# Patient Record
Sex: Female | Born: 1960 | Race: White | Hispanic: No | Marital: Married | Smoking: Never smoker
Health system: Southern US, Community
[De-identification: ages and names within clinical notes are randomized; demographics above are authoritative.]

## PROBLEM LIST (undated history)

## (undated) DIAGNOSIS — E785 Hyperlipidemia, unspecified: Secondary | ICD-10-CM

## (undated) DIAGNOSIS — Z8619 Personal history of other infectious and parasitic diseases: Secondary | ICD-10-CM

## (undated) DIAGNOSIS — Z8669 Personal history of other diseases of the nervous system and sense organs: Secondary | ICD-10-CM

## (undated) HISTORY — DX: Hyperlipidemia, unspecified: E78.5

## (undated) HISTORY — DX: Personal history of other infectious and parasitic diseases: Z86.19

## (undated) HISTORY — DX: Personal history of other diseases of the nervous system and sense organs: Z86.69

---

## 2005-02-10 ENCOUNTER — Ambulatory Visit (HOSPITAL_COMMUNITY): Admission: RE | Admit: 2005-02-10 | Discharge: 2005-02-10 | Payer: Self-pay | Admitting: Family Medicine

## 2005-02-21 ENCOUNTER — Encounter: Admission: RE | Admit: 2005-02-21 | Discharge: 2005-02-21 | Payer: Self-pay | Admitting: Obstetrics and Gynecology

## 2006-02-06 ENCOUNTER — Encounter: Admission: RE | Admit: 2006-02-06 | Discharge: 2006-02-06 | Payer: Self-pay | Admitting: Obstetrics and Gynecology

## 2007-11-26 ENCOUNTER — Encounter: Admission: RE | Admit: 2007-11-26 | Discharge: 2007-11-26 | Payer: Self-pay | Admitting: Obstetrics and Gynecology

## 2007-12-10 ENCOUNTER — Encounter: Admission: RE | Admit: 2007-12-10 | Discharge: 2007-12-10 | Payer: Self-pay | Admitting: Obstetrics and Gynecology

## 2008-08-25 HISTORY — PX: NECK SURGERY: SHX720

## 2009-03-23 ENCOUNTER — Encounter: Admission: RE | Admit: 2009-03-23 | Discharge: 2009-03-23 | Payer: Self-pay | Admitting: Sports Medicine

## 2009-05-25 ENCOUNTER — Encounter: Admission: RE | Admit: 2009-05-25 | Discharge: 2009-05-25 | Payer: Self-pay | Admitting: Obstetrics and Gynecology

## 2009-08-22 ENCOUNTER — Inpatient Hospital Stay (HOSPITAL_COMMUNITY): Admission: RE | Admit: 2009-08-22 | Discharge: 2009-08-23 | Payer: Self-pay | Admitting: Orthopedic Surgery

## 2010-09-15 ENCOUNTER — Encounter: Payer: Self-pay | Admitting: Obstetrics and Gynecology

## 2010-11-25 LAB — CBC
HCT: 39.2 % (ref 36.0–46.0)
Hemoglobin: 13.7 g/dL (ref 12.0–15.0)
MCHC: 34.8 g/dL (ref 30.0–36.0)
MCV: 88.6 fL (ref 78.0–100.0)
Platelets: 212 10*3/uL (ref 150–400)
RDW: 13 % (ref 11.5–15.5)
WBC: 6.8 10*3/uL (ref 4.0–10.5)

## 2012-01-12 ENCOUNTER — Other Ambulatory Visit: Payer: Self-pay | Admitting: Obstetrics and Gynecology

## 2012-01-12 DIAGNOSIS — Z1231 Encounter for screening mammogram for malignant neoplasm of breast: Secondary | ICD-10-CM

## 2012-01-23 ENCOUNTER — Ambulatory Visit
Admission: RE | Admit: 2012-01-23 | Discharge: 2012-01-23 | Disposition: A | Discharge status: Home / Self Care | Payer: BC Managed Care – PPO | Source: Ambulatory Visit | Attending: Obstetrics and Gynecology | Admitting: Obstetrics and Gynecology

## 2012-01-23 DIAGNOSIS — Z1231 Encounter for screening mammogram for malignant neoplasm of breast: Secondary | ICD-10-CM

## 2013-11-11 ENCOUNTER — Encounter (INDEPENDENT_AMBULATORY_CARE_PROVIDER_SITE_OTHER): Payer: Self-pay

## 2013-11-11 ENCOUNTER — Encounter: Payer: Self-pay | Admitting: Adult Health

## 2013-11-11 ENCOUNTER — Ambulatory Visit (INDEPENDENT_AMBULATORY_CARE_PROVIDER_SITE_OTHER): Payer: BC Managed Care – PPO | Admitting: Adult Health

## 2013-11-11 VITALS — BP 110/74 | HR 73 | Temp 98.5°F | Resp 14 | Ht 65.5 in | Wt 232.0 lb

## 2013-11-11 DIAGNOSIS — R5383 Other fatigue: Secondary | ICD-10-CM | POA: Insufficient documentation

## 2013-11-11 DIAGNOSIS — Z0000 Encounter for general adult medical examination without abnormal findings: Secondary | ICD-10-CM

## 2013-11-11 DIAGNOSIS — R5381 Other malaise: Secondary | ICD-10-CM

## 2013-11-11 DIAGNOSIS — Z1211 Encounter for screening for malignant neoplasm of colon: Secondary | ICD-10-CM | POA: Insufficient documentation

## 2013-11-11 DIAGNOSIS — E785 Hyperlipidemia, unspecified: Secondary | ICD-10-CM | POA: Insufficient documentation

## 2013-11-11 DIAGNOSIS — R635 Abnormal weight gain: Secondary | ICD-10-CM

## 2013-11-11 LAB — BASIC METABOLIC PANEL
BUN: 16 mg/dL (ref 6–23)
CO2: 30 meq/L (ref 19–32)
Calcium: 9.7 mg/dL (ref 8.4–10.5)
Chloride: 102 mEq/L (ref 96–112)
Creatinine, Ser: 0.7 mg/dL (ref 0.4–1.2)
GFR: 98.6 mL/min (ref >60.00)
GLUCOSE: 93 mg/dL (ref 70–99)
Potassium: 4.3 mEq/L (ref 3.5–5.1)
SODIUM: 139 meq/L (ref 135–145)

## 2013-11-11 LAB — HEPATIC FUNCTION PANEL
ALBUMIN: 4.4 g/dL (ref 3.5–5.2)
ALT: 51 U/L — ABNORMAL HIGH (ref 0–35)
AST: 32 U/L (ref 0–37)
Alkaline Phosphatase: 117 U/L (ref 39–117)
BILIRUBIN DIRECT: 0 mg/dL (ref 0.0–0.3)
BILIRUBIN TOTAL: 0.5 mg/dL (ref 0.3–1.2)
Total Protein: 7.6 g/dL (ref 6.0–8.3)

## 2013-11-11 LAB — CBC WITH DIFFERENTIAL/PLATELET
Basophils Absolute: 0 10*3/uL (ref 0.0–0.1)
Basophils Relative: 0.6 % (ref 0.0–3.0)
EOS ABS: 0.4 10*3/uL (ref 0.0–0.7)
Eosinophils Relative: 6.1 % — ABNORMAL HIGH (ref 0.0–5.0)
HCT: 42.9 % (ref 36.0–46.0)
Hemoglobin: 14.3 g/dL (ref 12.0–15.0)
LYMPHS PCT: 31.1 % (ref 12.0–46.0)
Lymphs Abs: 2 10*3/uL (ref 0.7–4.0)
MCHC: 33.4 g/dL (ref 30.0–36.0)
MCV: 88.8 fl (ref 78.0–100.0)
MONO ABS: 0.4 10*3/uL (ref 0.1–1.0)
MONOS PCT: 5.6 % (ref 3.0–12.0)
NEUTROS ABS: 3.7 10*3/uL (ref 1.4–7.7)
Neutrophils Relative %: 56.6 % (ref 43.0–77.0)
PLATELETS: 201 10*3/uL (ref 150.0–400.0)
RBC: 4.83 Mil/uL (ref 3.87–5.11)
RDW: 13.1 % (ref 11.5–14.6)
WBC: 6.5 10*3/uL (ref 4.5–10.5)

## 2013-11-11 LAB — LIPID PANEL
CHOL/HDL RATIO: 7
Cholesterol: 313 mg/dL — ABNORMAL HIGH (ref 0–200)
HDL: 45.1 mg/dL (ref >39.00)
LDL CALC: 230 mg/dL — AB (ref 0–99)
TRIGLYCERIDES: 188 mg/dL — AB (ref 0.0–149.0)
VLDL: 37.6 mg/dL (ref 0.0–40.0)

## 2013-11-11 LAB — VITAMIN B12: VITAMIN B 12: 290 pg/mL (ref 211–911)

## 2013-11-11 LAB — TSH: TSH: 0.76 u[IU]/mL (ref 0.35–5.50)

## 2013-11-11 NOTE — Progress Notes (Signed)
Patient ID: Diana Finley, female   DOB: 06-Nov-1960, 53 y.o.   MRN: [id number]    Subjective:    Patient ID: Diana Finley, female    DOB: 06-Nov-1960, 53 y.o.   MRN: [id number]  HPI  Pt is a pleasant 53 y/o female who presents to clinic to establish care. She is followed by Dr. Senaida Oresichardson, Olegario MessierKathy. Last PAP was 2014. She is scheduled for her mammogram. She has not had a screening colonoscopy. Diana Finley would like help in losing weight. She is wondering if her thyroid is part of the problem. She reports being under stress and eats more when stressed.    Past Medical History  Diagnosis Date  . Hyperlipidemia   . History of chickenpox   . History of migraine     Dont have them currently      Past Surgical History  Procedure Laterality Date  . Neck surgery  2010    Cervical fusion     Family History  Problem Relation Age of Onset  . Hyperlipidemia Mother   . Hypertension Mother   . Cancer Mother     Skin Cancer   . Hyperlipidemia Father   . Hypertension Father   . Early death Sister     MVA     History   Social History  . Marital Status: Married    Spouse Name: N/A    Number of Children: 2  . Years of Education: 14   Occupational History  . Dental Assistant    Social History Main Topics  . Smoking status: Never Smoker   . Smokeless tobacco: Not on file  . Alcohol Use: No  . Drug Use: No  . Sexual Activity: Not on file   Other Topics Concern  . Not on file   Social History Narrative   Diana Finley [place] up in [place], [place]. She currently lives in [place], [place] with her husband. She works as a Sales executivedental assistant. She enjoys reading a working outside in the yard.    Review of Systems  Constitutional: Positive for fatigue.  HENT: Negative.   Eyes: Negative.   Respiratory: Negative.   Cardiovascular: Negative.   Gastrointestinal: Negative.   Endocrine: Negative.   Genitourinary: Negative.   Musculoskeletal: Negative.   Skin: Negative.     Allergic/Immunologic: Negative.   Neurological: Negative.   Hematological: Negative.   Psychiatric/Behavioral: Negative.  Nervous/anxious: increased stress - situational.        Objective:  There were no vitals taken for this visit.   Physical Exam  Constitutional: She is oriented to person, place, and time. She appears well-developed and well-nourished. No distress.  HENT:  Head: Normocephalic and atraumatic.  Right Ear: External ear normal.  Left Ear: External ear normal.  Nose: Nose normal.  Mouth/Throat: Oropharynx is clear and moist.  Eyes: Conjunctivae and EOM are normal. Pupils are equal, round, and reactive to light.  Neck: Normal range of motion. Neck supple. No tracheal deviation present. No thyromegaly present.  Cardiovascular: Normal rate, regular rhythm, normal heart sounds and intact distal pulses.  Exam reveals no gallop and no friction rub.   No murmur heard. Pulmonary/Chest: Effort normal and breath sounds normal. No respiratory distress. She has no wheezes. She has no rales.  Abdominal: Soft. Bowel sounds are normal. She exhibits no distension and no mass. There is no tenderness. There is no rebound and no guarding.  Musculoskeletal: Normal range of motion. She exhibits no edema and no tenderness.  Lymphadenopathy:    She  has no cervical adenopathy.  Neurological: She is alert and oriented to person, place, and time. She has normal reflexes. No cranial nerve deficit. Coordination normal.  Skin: Skin is warm and dry.  Psychiatric: She has a normal mood and affect. Her behavior is normal. Judgment and thought content normal.       Assessment & Plan:   1. Routine general medical examination at a health care facility Normal physical exam excluding breast, pelvic/pap. She is followed by GYN. Recently had exam. Screenings addressed. Will check routine labs  - CBC with Differential - Vit D  25 hydroxy (rtn osteoporosis monitoring) - Vitamin B12 - Basic metabolic  panel - Hepatic function panel  2. Screen for colon cancer Refer to Dr. Mechele Collin for her screening colonoscopy.  - Ambulatory referral to Gastroenterology  3. HLD (hyperlipidemia) Reports hx of HLD never on any medication. Family hx of HLD. Check labs. If elevated then start statin. - Lipid panel  4. Fatigue Check cbc and thyroid. Pt is deconditioned which can also be cause for her fatigue  - TSH - CBC  5. Weight Gain Pt was instructed to keep a food diary. Return in 1-2 weeks. We will discuss diet, exercise, labs and potential options for medication to help with weight control

## 2013-11-11 NOTE — Progress Notes (Signed)
Pre visit review using our clinic review tool, if applicable. No additional management support is needed unless otherwise documented below in the visit note. 

## 2013-11-11 NOTE — Patient Instructions (Signed)
   Thank you for choosing [facility] for your health care needs.  Please have your labs drawn prior to leaving the office.  The results will be available through MyChart for your convenience. Please remember to activate this. The activation code is located at the end of this form.  I am referring you for your screening colonoscopy with Dr. Mechele CollinElliott at [facility]. They will contact you with an appointment.  Please return in 1-2 weeks to discuss weight management. Please bring your food log with you that we discussed.

## 2013-11-12 LAB — VITAMIN D 25 HYDROXY (VIT D DEFICIENCY, FRACTURES): Vit D, 25-Hydroxy: 41 ng/mL (ref 30–89)

## 2013-11-18 ENCOUNTER — Ambulatory Visit (INDEPENDENT_AMBULATORY_CARE_PROVIDER_SITE_OTHER): Payer: BC Managed Care – PPO | Admitting: Adult Health

## 2013-11-18 ENCOUNTER — Encounter: Payer: Self-pay | Admitting: Adult Health

## 2013-11-18 VITALS — BP 106/70 | HR 76 | Temp 98.1°F | Resp 14 | Ht 64.5 in | Wt 230.0 lb

## 2013-11-18 DIAGNOSIS — R635 Abnormal weight gain: Secondary | ICD-10-CM

## 2013-11-18 MED ORDER — PHENTERMINE HCL 37.5 MG PO TABS
37.5000 mg | ORAL_TABLET | Freq: Every day | ORAL | Status: DC
Start: 2013-11-18 — End: 2013-12-23

## 2013-11-18 NOTE — Progress Notes (Signed)
Patient ID: Diana OuMichelle R Finley, female   DOB: 07/12/1961, 53 y.o.   MRN: [id number]    Subjective:    Patient ID: Diana OuMichelle R Finley, female    DOB: 07/12/1961, 53 y.o.   MRN: [id number]  HPI  Pt was instructed to keep a food diary and return in 1-2 weeks to discuss weight management. She is motivated to lose weight.    Past Medical History  Diagnosis Date  . Hyperlipidemia   . History of chickenpox   . History of migraine     Dont have them currently      Review of Systems  Constitutional: Negative.   HENT: Negative.   Eyes: Negative.   Respiratory: Negative.   Cardiovascular: Negative.   Gastrointestinal: Negative.   Endocrine: Negative.   Genitourinary: Negative.   Musculoskeletal: Negative.   Skin: Negative.   Allergic/Immunologic: Negative.   Neurological: Negative.   Hematological: Negative.   Psychiatric/Behavioral: Negative.        Objective:  BP 106/70  Pulse 76  Temp(Src) 98.1 F (36.7 C) (Oral)  Ht 5' 4.5" (1.638 m)  Wt 230 lb (104.327 kg)  BMI 38.88 kg/m2  SpO2 96%   Physical Exam  Constitutional: She is oriented to person, place, and time. No distress.  Cardiovascular: Normal rate and regular rhythm.   Blood pressure is very good  Pulmonary/Chest: Effort normal. No respiratory distress.  Musculoskeletal: Normal range of motion.  Neurological: She is oriented to person, place, and time.  Psychiatric: She has a normal mood and affect. Her behavior is normal. Judgment and thought content normal.       Assessment & Plan:   1. Weight gain Discussed diet in detail - low glycemic. Provided with Dr. Melina Schoolsullo's diet and review as well. Start Phentermine and discussed side effects. Start to increase activity. Start slow and progress to more activity. RTC in 1 month for follow up and recheck lipids and liver panel.

## 2013-11-18 NOTE — Progress Notes (Signed)
Pre visit review using our clinic review tool, if applicable. No additional management support is needed unless otherwise documented below in the visit note. 

## 2013-11-18 NOTE — Patient Instructions (Addendum)
  Start 1/2 tablet of phentermine. If you feel that you want to add the other half you can do so before lunch.  Eat slow this will allow your brain to send signals to your stomach that you are full.  Increase physical activity. Start slow and move forward with activity.  Follow the diet program that I gave you.  Return in one month for lab work and to follow up on weight loss.

## 2013-12-23 ENCOUNTER — Encounter: Payer: Self-pay | Admitting: Adult Health

## 2013-12-23 ENCOUNTER — Ambulatory Visit (INDEPENDENT_AMBULATORY_CARE_PROVIDER_SITE_OTHER): Payer: BC Managed Care – PPO | Admitting: Adult Health

## 2013-12-23 VITALS — BP 124/85 | HR 79 | Temp 98.1°F | Resp 14 | Wt 219.0 lb

## 2013-12-23 DIAGNOSIS — E663 Overweight: Secondary | ICD-10-CM

## 2013-12-23 DIAGNOSIS — R7402 Elevation of levels of lactic acid dehydrogenase (LDH): Secondary | ICD-10-CM

## 2013-12-23 DIAGNOSIS — R748 Abnormal levels of other serum enzymes: Secondary | ICD-10-CM

## 2013-12-23 DIAGNOSIS — R7401 Elevation of levels of liver transaminase levels: Secondary | ICD-10-CM

## 2013-12-23 DIAGNOSIS — R740 Nonspecific elevation of levels of transaminase and lactic acid dehydrogenase [LDH]: Principal | ICD-10-CM

## 2013-12-23 LAB — ALT: ALT: 39 U/L — AB (ref 0–35)

## 2013-12-23 MED ORDER — PHENTERMINE HCL 37.5 MG PO CAPS
37.5000 mg | ORAL_CAPSULE | ORAL | Status: DC
Start: 2013-12-23 — End: 2014-03-24

## 2013-12-23 NOTE — Progress Notes (Signed)
Pre visit review using our clinic review tool, if applicable. No additional management support is needed unless otherwise documented below in the visit note. 

## 2013-12-23 NOTE — Progress Notes (Signed)
Patient ID: Diana OuMichelle R Kloth, female   DOB: October 19, 1960, 53 y.o.   MRN: [id number]   Subjective:    Patient ID: Diana Finley, female    DOB: October 19, 1960, 53 y.o.   MRN: [id number]  HPI  Pleasant 53 year old female who presents to clinic for followup of elevated liver enzymes and for controlled weight loss. Patient was seen in clinic approximately one month ago. She was started on phentermine 37.5 mg daily. She was provided with Dr. Melina Schoolsullo's diet. She reports that medication is working well. No side effects reported. No elevation in her blood pressure. She does not feel jittery with this medication. She is taking medication exactly as prescribed. She is following diet and exercise. Patient has lost 13 pounds since her last visit. She was congratulated on this.  She has a history of hyperlipidemia and elevated liver enzymes. Patient first wanted to try diet and exercise prior to starting statin. No heart history reported or family history of cardiac disease. We did discuss allowing her time to continue with change in lifestyle to see if her cholesterol improves. She is very motivated. If her cholesterol is still elevated in June we will begin statin therapy.  Past Medical History  Diagnosis Date  . Hyperlipidemia   . History of chickenpox   . History of migraine     Dont have them currently     Review of Systems  Constitutional: Negative.   HENT: Negative.   Eyes: Negative.   Respiratory: Negative.   Cardiovascular: Negative.  Negative for chest pain and palpitations.  Gastrointestinal: Negative.   Endocrine: Negative.   Genitourinary: Negative.   Musculoskeletal: Negative.   Skin: Negative.   Allergic/Immunologic: Negative.   Neurological: Negative.   Hematological: Negative.   Psychiatric/Behavioral: Negative.  Negative for sleep disturbance. The patient is not nervous/anxious.        Objective:  BP 124/85  Pulse 79  Temp(Src) 98.1 F (36.7 C) (Oral)  Resp 14  Wt 219  lb (99.338 kg)  SpO2 100%   Physical Exam  Constitutional: She is oriented to person, place, and time. No distress.  13 lb weight loss in ~ 1 month  HENT:  Head: Normocephalic and atraumatic.  Eyes: Conjunctivae and EOM are normal.  Neck: Normal range of motion. Neck supple.  Cardiovascular: Normal rate, regular rhythm, normal heart sounds and intact distal pulses.  Exam reveals no gallop and no friction rub.   No murmur heard. Pulmonary/Chest: Effort normal and breath sounds normal. No respiratory distress. She has no wheezes. She has no rales.  Musculoskeletal: Normal range of motion.  Neurological: She is alert and oriented to person, place, and time. She has normal reflexes. Coordination normal.  Skin: Skin is warm and dry.  Psychiatric: She has a normal mood and affect. Her behavior is normal. Judgment and thought content normal.      Assessment & Plan:   1. Elevated ALT measurement Recheck levels today. Continue to follow. Follow up in 2 months and we will also check cholesterol at that time. - ALT  3. Overweight 13 lb weight loss in 1 month. Continue phentermine as prescribed. Prescription for capsules given today as her pharmacist advised that capsules will be less expensive. Congratulated patient on her accomplishment and advised to continue. I will see her again in 2 months.

## 2013-12-25 ENCOUNTER — Encounter: Payer: Self-pay | Admitting: Adult Health

## 2014-02-03 ENCOUNTER — Ambulatory Visit: Payer: BC Managed Care – PPO | Admitting: Adult Health

## 2014-03-14 ENCOUNTER — Other Ambulatory Visit: Payer: Self-pay

## 2014-03-14 DIAGNOSIS — Z1231 Encounter for screening mammogram for malignant neoplasm of breast: Secondary | ICD-10-CM

## 2014-03-24 ENCOUNTER — Encounter: Payer: Self-pay | Admitting: Adult Health

## 2014-03-24 ENCOUNTER — Ambulatory Visit (INDEPENDENT_AMBULATORY_CARE_PROVIDER_SITE_OTHER): Payer: BC Managed Care – PPO | Admitting: Adult Health

## 2014-03-24 ENCOUNTER — Encounter: Payer: Self-pay | Admitting: *Deleted

## 2014-03-24 VITALS — BP 118/74 | HR 77 | Temp 98.1°F | Resp 14 | Wt 211.0 lb

## 2014-03-24 DIAGNOSIS — R740 Nonspecific elevation of levels of transaminase and lactic acid dehydrogenase [LDH]: Principal | ICD-10-CM

## 2014-03-24 DIAGNOSIS — R7402 Elevation of levels of lactic acid dehydrogenase (LDH): Secondary | ICD-10-CM

## 2014-03-24 DIAGNOSIS — R7401 Elevation of levels of liver transaminase levels: Secondary | ICD-10-CM | POA: Insufficient documentation

## 2014-03-24 LAB — HEPATIC FUNCTION PANEL
ALT: 24 U/L (ref 0–35)
AST: 21 U/L (ref 0–37)
Albumin: 4 g/dL (ref 3.5–5.2)
Alkaline Phosphatase: 107 U/L (ref 39–117)
Bilirubin, Direct: 0.1 mg/dL (ref 0.0–0.3)
Total Bilirubin: 0.5 mg/dL (ref 0.2–1.2)
Total Protein: 6.9 g/dL (ref 6.0–8.3)

## 2014-03-24 LAB — IRON: Iron: 82 ug/dL (ref 42–145)

## 2014-03-24 LAB — FERRITIN: Ferritin: 88.7 ng/mL (ref 10.0–291.0)

## 2014-03-24 NOTE — Progress Notes (Signed)
Patient ID: Diana Finley, female   DOB: 26-May-1961, 53 y.o.   MRN: [id number]   Subjective:    Patient ID: Diana OuMichelle R Pickering, female    DOB: 26-May-1961, 53 y.o.   MRN: [id number]  HPI  Returns for f/u for elevated liver enzymes. We were not able to start her on statin for elevated cholesterol 2/2 to this. Her ALT has come down somewhat but not normal. She is feeling well overall. Still working on weight loss. She has lost 8 lbs since May.    Past Medical History  Diagnosis Date  . Hyperlipidemia   . History of chickenpox   . History of migraine     Dont have them currently     Current Outpatient Prescriptions on File Prior to Visit  Medication Sig Dispense Refill  . phentermine 37.5 MG capsule Take 1 capsule (37.5 mg total) by mouth every morning.  30 capsule  2   No current facility-administered medications on file prior to visit.     Review of Systems  Constitutional: Negative.   HENT: Negative.   Eyes: Negative.   Respiratory: Negative.   Cardiovascular: Negative.   Gastrointestinal: Negative.   Endocrine: Negative.   Genitourinary: Negative.   Musculoskeletal: Negative.   Skin: Negative.   Allergic/Immunologic: Negative.   Neurological: Negative.   Hematological: Negative.   Psychiatric/Behavioral: Negative.        Objective:  BP 118/74  Pulse 77  Temp(Src) 98.1 F (36.7 C) (Oral)  Resp 14  Wt 211 lb (95.709 kg)  SpO2 98%   Physical Exam  Constitutional: She is oriented to person, place, and time. No distress.  HENT:  Head: Normocephalic and atraumatic.  Eyes: Conjunctivae and EOM are normal.  Neck: Normal range of motion. Neck supple.  Cardiovascular: Normal rate, regular rhythm, normal heart sounds and intact distal pulses.  Exam reveals no gallop and no friction rub.   No murmur heard. Pulmonary/Chest: Effort normal and breath sounds normal. No respiratory distress. She has no wheezes. She has no rales.  Abdominal: Soft. Bowel sounds are  normal.  Musculoskeletal: Normal range of motion.  Neurological: She is alert and oriented to person, place, and time. She has normal reflexes. Coordination normal.  Skin: Skin is warm and dry.  Psychiatric: She has a normal mood and affect. Her behavior is normal. Judgment and thought content normal.      Assessment & Plan:   1. Transaminitis Check labs. Continue to encourage weight loss. Will send for ultrasound if abnormal labs. Follow - Ferritin - Iron - Iron and TIBC - Hepatitis B surface antigen - Hepatitis B surface antibody - Hepatitis B core antibody, total - Hepatitis C antibody - Hepatitis A antibody, IgM - ANA - Anti-Smith antibody - Hepatic function panel

## 2014-03-24 NOTE — Progress Notes (Signed)
Pre visit review using our clinic review tool, if applicable. No additional management support is needed unless otherwise documented below in the visit note. 

## 2014-03-24 NOTE — Addendum Note (Signed)
Addended by: Montine CircleMALDONADO, Gaylon Bentz D on: 03/24/2014 01:54 PM   Modules accepted: Orders

## 2014-03-25 LAB — HEPATITIS B CORE ANTIBODY, TOTAL: Hep B Core Total Ab: NONREACTIVE

## 2014-03-25 LAB — IRON AND TIBC
%SAT: 24 % (ref 20–55)
Iron: 81 ug/dL (ref 42–145)
TIBC: 339 ug/dL (ref 250–470)
UIBC: 258 ug/dL (ref 125–400)

## 2014-03-25 LAB — HEPATITIS C ANTIBODY: HCV AB: NEGATIVE

## 2014-03-25 LAB — HEPATITIS A ANTIBODY, TOTAL: HEP A TOTAL AB: NONREACTIVE

## 2014-03-25 LAB — HEPATITIS B SURFACE ANTIBODY,QUALITATIVE: HEP B S AB: NEGATIVE

## 2014-03-25 LAB — HEPATITIS A ANTIBODY, IGM: HEP A IGM: NONREACTIVE

## 2014-03-25 LAB — HEPATITIS B SURFACE ANTIGEN: HEP B S AG: NEGATIVE

## 2014-03-27 LAB — ANTI-SMITH ANTIBODY: ENA SM AB SER-ACNC: NEGATIVE

## 2014-03-31 ENCOUNTER — Ambulatory Visit: Payer: BC Managed Care – PPO

## 2014-04-07 ENCOUNTER — Encounter (INDEPENDENT_AMBULATORY_CARE_PROVIDER_SITE_OTHER): Payer: Self-pay

## 2014-04-07 ENCOUNTER — Ambulatory Visit
Admission: RE | Admit: 2014-04-07 | Discharge: 2014-04-07 | Disposition: A | Discharge status: Home / Self Care | Payer: BC Managed Care – PPO | Source: Ambulatory Visit

## 2014-04-07 DIAGNOSIS — Z1231 Encounter for screening mammogram for malignant neoplasm of breast: Secondary | ICD-10-CM

## 2014-11-01 ENCOUNTER — Telehealth: Payer: Self-pay | Admitting: Adult Health

## 2014-11-01 NOTE — Telephone Encounter (Signed)
Spoke to patient, states she will be seen in UC in [place] today.

## 2014-11-01 NOTE — Telephone Encounter (Signed)
Patient Name: Diana CapuchinMICHELLE Novakowski  DOB: February 04, 1961    Initial Comment Caller states she has a head cold that has gotten to her chest.    Nurse Assessment  Nurse: Renaldo FiddlerAdkins, RN, Raynelle FanningJulie Date/Time Lamount Cohen(Eastern Time): 11/01/2014 11:47:21 AM  Confirm and document reason for call. If symptomatic, describe symptoms. ---Caller states she had a "head cold" that has gotten in her chest. She is having coughing spasms that are getting worse, she is wheezing and her secretions are yellow at times. She has felt flushed, but no fever.  Has the patient traveled out of the country within the last 30 days? ---Not Applicable  Does the patient require triage? ---Yes  Related visit to physician within the last 2 weeks? ---No  Does the PT have any chronic conditions? (i.e. diabetes, asthma, etc.) ---No  Did the patient indicate they were pregnant? ---No     Guidelines    Guideline Title Affirmed Question Affirmed Notes  Cough - Acute Productive Wheezing is present    Final Disposition User   See Physician within 4 Hours (or PCP triage) Renaldo FiddlerAdkins, RN, Raynelle FanningJulie    Comments  I advised the caller that there were no afternoon appts available, states her wheezing has been intermittent and she would rather wait until tomorrow instead of going to ED or UC. Advised that I would include this in her note and she will be contacted by the office. Verbalized understanding, will cb as needed if her sx worsen.

## 2015-12-25 ENCOUNTER — Other Ambulatory Visit: Payer: Self-pay

## 2015-12-25 DIAGNOSIS — Z1231 Encounter for screening mammogram for malignant neoplasm of breast: Secondary | ICD-10-CM

## 2016-01-07 ENCOUNTER — Ambulatory Visit
Admission: RE | Admit: 2016-01-07 | Discharge: 2016-01-07 | Disposition: A | Discharge status: Home / Self Care | Payer: BLUE CROSS/BLUE SHIELD | Source: Ambulatory Visit

## 2016-01-07 DIAGNOSIS — Z1231 Encounter for screening mammogram for malignant neoplasm of breast: Secondary | ICD-10-CM

## 2017-03-31 ENCOUNTER — Other Ambulatory Visit: Payer: Self-pay | Admitting: Obstetrics and Gynecology

## 2017-03-31 DIAGNOSIS — Z1231 Encounter for screening mammogram for malignant neoplasm of breast: Secondary | ICD-10-CM

## 2017-04-09 ENCOUNTER — Ambulatory Visit
Admission: RE | Admit: 2017-04-09 | Discharge: 2017-04-09 | Disposition: A | Discharge status: Home / Self Care | Payer: BLUE CROSS/BLUE SHIELD | Source: Ambulatory Visit | Attending: Obstetrics and Gynecology | Admitting: Obstetrics and Gynecology

## 2017-04-09 DIAGNOSIS — Z1231 Encounter for screening mammogram for malignant neoplasm of breast: Secondary | ICD-10-CM

## 2017-08-24 DIAGNOSIS — R200 Anesthesia of skin: Secondary | ICD-10-CM | POA: Insufficient documentation

## 2017-09-02 DIAGNOSIS — G5601 Carpal tunnel syndrome, right upper limb: Secondary | ICD-10-CM | POA: Insufficient documentation

## 2019-01-06 ENCOUNTER — Emergency Department
Admission: EM | Admit: 2019-01-06 | Discharge: 2019-01-06 | Disposition: A | Discharge status: Home / Self Care | Payer: BLUE CROSS/BLUE SHIELD | Attending: Emergency Medicine | Admitting: Emergency Medicine

## 2019-01-06 ENCOUNTER — Other Ambulatory Visit: Payer: Self-pay

## 2019-01-06 ENCOUNTER — Encounter: Payer: Self-pay | Admitting: Emergency Medicine

## 2019-01-06 DIAGNOSIS — S61412A Laceration without foreign body of left hand, initial encounter: Secondary | ICD-10-CM | POA: Diagnosis present

## 2019-01-06 DIAGNOSIS — Y9389 Activity, other specified: Secondary | ICD-10-CM | POA: Insufficient documentation

## 2019-01-06 DIAGNOSIS — Y9289 Other specified places as the place of occurrence of the external cause: Secondary | ICD-10-CM | POA: Insufficient documentation

## 2019-01-06 DIAGNOSIS — Y998 Other external cause status: Secondary | ICD-10-CM | POA: Insufficient documentation

## 2019-01-06 DIAGNOSIS — W260XXA Contact with knife, initial encounter: Secondary | ICD-10-CM | POA: Diagnosis not present

## 2019-01-06 MED ORDER — CEPHALEXIN 500 MG PO CAPS
500.0000 mg | ORAL_CAPSULE | Freq: Once | ORAL | Status: AC
  Administered 2019-01-06: 21:00:00 500 mg via ORAL
  Filled 2019-01-06: qty 1

## 2019-01-06 MED ORDER — CEPHALEXIN 500 MG PO CAPS: 500.0000 mg | ORAL_CAPSULE | Freq: Four times a day (QID) | ORAL | 0 refills | Status: AC

## 2019-01-06 NOTE — ED Triage Notes (Signed)
Patient ambulatory to triage with steady gait, without difficulty or distress noted; pt reports cutting self with knife PTA; 2 small lac to top of left hand with no bleeding at present; guaze dressing applied

## 2019-01-06 NOTE — ED Provider Notes (Signed)
[facility] Emergency Department Provider Note  ____________________________________________  Time seen: Approximately 9:12 PM  I have reviewed the triage vital signs and the nursing notes.   HISTORY  Chief Complaint Laceration    HPI Diana Finley is a 58 y.o. female that presents to the emergency department for evaluation of laceration to left hand from a pair knife at work.  Patient states that she was cutting cheese when the knife somehow slipped and went through the top of her hand.  Knife went in one side right underneath the skin and out the middle.  Tetanus shot was in January.   Past Medical History:  Diagnosis Date  . History of chickenpox   . History of migraine    Dont have them currently   . Hyperlipidemia     Patient Active Problem List   Diagnosis Date Noted  . Transaminitis 03/24/2014  . Elevated liver enzymes 12/23/2013  . Overweight(278.02) 12/23/2013  . HLD (hyperlipidemia) 11/11/2013  . Routine general medical examination at a health care facility 11/11/2013  . Screen for colon cancer 11/11/2013  . Fatigue 11/11/2013  . Weight gain 11/11/2013    Past Surgical History:  Procedure Laterality Date  . NECK SURGERY  2010   Cervical fusion    Prior to Admission medications   Medication Sig Start Date End Date Taking? Authorizing Provider  cephALEXin (KEFLEX) 500 MG capsule Take 1 capsule (500 mg total) by mouth 4 (four) times daily for 10 days. 01/06/19 01/16/19  Enid DerryWagner, Yaquelin Langelier, PA-C    Allergies Patient has no known allergies.  Family History  Problem Relation Age of Onset  . Hyperlipidemia Mother   . Hypertension Mother   . Cancer Mother        Skin Cancer   . Hyperlipidemia Father   . Hypertension Father   . Early death Sister        MVA    Social History Social History   Tobacco Use  . Smoking status: Never Smoker  . Smokeless tobacco: Never Used  Substance Use Topics  . Alcohol use: No  . Drug use:  No     Review of Systems  Constitutional: No fever/chills Gastrointestinal: No nausea, no vomiting.  Musculoskeletal: Positive for hand pain. Skin: Negative for rash, ecchymosis.  Positive for laceration. Neurological: Negative for numbness or tingling   ____________________________________________   PHYSICAL EXAM:  VITAL SIGNS: ED Triage Vitals  Enc Vitals Group     BP 01/06/19 2006 (!) 143/75     Pulse Rate 01/06/19 2006 87     Resp 01/06/19 2006 16     Temp 01/06/19 2006 98 F (36.7 C)     Temp src --      SpO2 01/06/19 2006 100 %     Weight 01/06/19 2004 245 lb (111.1 kg)     Height 01/06/19 2004 5\' 5"  (1.651 m)     Head Circumference --      Peak Flow --      Pain Score 01/06/19 2004 3     Pain Loc --      Pain Edu? --      Excl. in GC? --      Constitutional: Alert and oriented. Well appearing and in no acute distress. Eyes: Conjunctivae are normal. PERRL. EOMI. Head: Atraumatic. ENT:      Ears:      Nose: No congestion/rhinnorhea.      Mouth/Throat: Mucous membranes are moist.  Neck: No stridor.  Cardiovascular: Normal  rate, regular rhythm.  Good peripheral circulation. Respiratory: Normal respiratory effort without tachypnea or retractions. Lungs CTAB. Good air entry to the bases with no decreased or absent breath sounds. Musculoskeletal: Full range of motion to all extremities. No gross deformities appreciated.  Full flexion and extension of all digits. Neurologic:  Normal speech and language. No gross focal neurologic deficits are appreciated.  Skin:  Skin is warm, dry.  Shallow through and through laceration to left dorsal hand. Psychiatric: Mood and affect are normal. Speech and behavior are normal. Patient exhibits appropriate insight and judgement.   ____________________________________________   LABS (all labs ordered are listed, but only abnormal results are displayed)  Labs Reviewed - No data to  display ____________________________________________  EKG   ____________________________________________  RADIOLOGY   No results found.  ____________________________________________    PROCEDURES  Procedure(s) performed:    Procedures  LACERATION REPAIR Performed by: Enid Derry  Consent: Verbal consent obtained.  Consent given by: patient  Prepped and Draped in normal sterile fashion  Wound explored: No foreign bodies   Laceration Location: hand  Laceration Length: 1 cm and 1/2 cm  Irrigation method: syringe  Amount of cleaning: normal saline  Skin closure: Steri-Strips  Patient tolerance: Patient tolerated the procedure well with no immediate complications.  Medications  cephALEXin (KEFLEX) capsule 500 mg (500 mg Oral Given 01/06/19 2052)     ____________________________________________   INITIAL IMPRESSION / ASSESSMENT AND PLAN / ED COURSE  Pertinent labs & imaging results that were available during my care of the patient were reviewed by me and considered in my medical decision making (see chart for details).  Review of the [place] CSRS was performed in accordance of the [facility] prior to dispensing any controlled drugs.   Patient's diagnosis is consistent with knife laceration.  Vital signs and exam are reassuring.  There is a shallow through and through laceration under the skin of her left hand.  Lacerations were repaired with Steri-Strips.  Patient will be discharged home with prescriptions for Keflex. Patient is to follow up with primary care as directed. Patient is given ED precautions to return to the ED for any worsening or new symptoms.     ____________________________________________  FINAL CLINICAL IMPRESSION(S) / ED DIAGNOSES  Final diagnoses:  Laceration of left hand, foreign body presence unspecified, initial encounter      NEW MEDICATIONS STARTED DURING THIS VISIT:  ED Discharge Orders         Ordered    cephALEXin  (KEFLEX) 500 MG capsule  4 times daily     01/06/19 2113              This chart was dictated using voice recognition software/Dragon. Despite best efforts to proofread, errors can occur which can change the meaning. Any change was purely unintentional.    Enid Derry, PA-C 01/06/19 2202    Emily Filbert, MD 01/06/19 [phone]

## 2019-01-06 NOTE — Discharge Instructions (Addendum)
Please keep hand covered and dry.  Strips and glue will fall off in about 5 days.  Wear wrist splint for 48 hours.  Take Keflex to prevent infection.  Please follow-up with primary care as needed.

## 2019-08-03 ENCOUNTER — Other Ambulatory Visit: Payer: Self-pay | Admitting: Obstetrics and Gynecology

## 2019-08-03 DIAGNOSIS — Z1231 Encounter for screening mammogram for malignant neoplasm of breast: Secondary | ICD-10-CM

## 2019-09-23 ENCOUNTER — Ambulatory Visit: Payer: BLUE CROSS/BLUE SHIELD

## 2019-10-14 ENCOUNTER — Ambulatory Visit: Payer: BC Managed Care – PPO | Attending: Internal Medicine

## 2019-10-14 DIAGNOSIS — Z23 Encounter for immunization: Secondary | ICD-10-CM | POA: Insufficient documentation

## 2019-10-14 NOTE — Progress Notes (Signed)
   Covid-19 Vaccination Clinic  Name:  KEILYNN MARANO    MRN: [id number] DOB: July 16, 1961  10/14/2019  Ms. Oberman was observed post Covid-19 immunization for 15 minutes without incidence. She was provided with Vaccine Information Sheet and instruction to access the V-Safe system.   Ms. Calico was instructed to call 911 with any severe reactions post vaccine: Marland Kitchen Difficulty breathing  . Swelling of your face and throat  . A fast heartbeat  . A bad rash all over your body  . Dizziness and weakness    Immunizations Administered    Name Date Dose VIS Date Route   Pfizer COVID-19 Vaccine 10/14/2019  1:16 PM 0.3 mL 08/05/2019 Intramuscular   Manufacturer: [employer], [employer]   Lot: [id number]   NDC: [address]-6

## 2019-10-28 ENCOUNTER — Ambulatory Visit
Admission: RE | Admit: 2019-10-28 | Discharge: 2019-10-28 | Disposition: A | Discharge status: Home / Self Care | Payer: BLUE CROSS/BLUE SHIELD | Source: Ambulatory Visit | Attending: Obstetrics and Gynecology | Admitting: Obstetrics and Gynecology

## 2019-10-28 ENCOUNTER — Other Ambulatory Visit: Payer: Self-pay

## 2019-10-28 DIAGNOSIS — Z1231 Encounter for screening mammogram for malignant neoplasm of breast: Secondary | ICD-10-CM

## 2019-11-08 ENCOUNTER — Ambulatory Visit: Payer: BC Managed Care – PPO | Attending: Internal Medicine

## 2019-11-08 DIAGNOSIS — Z23 Encounter for immunization: Secondary | ICD-10-CM

## 2019-11-08 NOTE — Progress Notes (Signed)
2  Covid-19 Vaccination Clinic  Name:  SALENE MOHAMUD    MRN: [id number] DOB: Jun 30, 1961  11/08/2019  Ms. Cadena was observed post Covid-19 immunization for 15 minutes without incident. She was provided with Vaccine Information Sheet and instruction to access the V-Safe system.   Ms. Horseman was instructed to call 911 with any severe reactions post vaccine: Marland Kitchen Difficulty breathing  . Swelling of face and throat  . A fast heartbeat  . A bad rash all over body  . Dizziness and weakness   Immunizations Administered    Name Date Dose VIS Date Route   Pfizer COVID-19 Vaccine 11/08/2019  8:27 AM 0.3 mL 08/05/2019 Intramuscular   Manufacturer: [employer], [employer]   Lot: [id number]   NDC: [address]-6

## 2022-01-27 ENCOUNTER — Other Ambulatory Visit: Payer: Self-pay | Admitting: Obstetrics and Gynecology

## 2022-01-27 DIAGNOSIS — Z1231 Encounter for screening mammogram for malignant neoplasm of breast: Secondary | ICD-10-CM

## 2022-02-14 ENCOUNTER — Ambulatory Visit
Admission: RE | Admit: 2022-02-14 | Discharge: 2022-02-14 | Disposition: A | Discharge status: Home / Self Care | Payer: BC Managed Care – PPO | Source: Ambulatory Visit | Attending: Obstetrics and Gynecology | Admitting: Obstetrics and Gynecology

## 2022-02-14 ENCOUNTER — Ambulatory Visit: Payer: BC Managed Care – PPO

## 2022-02-14 DIAGNOSIS — Z1231 Encounter for screening mammogram for malignant neoplasm of breast: Secondary | ICD-10-CM

## 2022-08-14 IMAGING — MG MM DIGITAL SCREENING BILAT W/ TOMO AND CAD
6 of 10 series · 6 of 30 positions shown · non-contrast
Comparison: Previous exam(s).

CLINICAL DATA: Screening.

EXAM:
DIGITAL SCREENING BILATERAL MAMMOGRAM WITH TOMOSYNTHESIS AND CAD
TECHNIQUE: Bilateral screening digital craniocaudal and mediolateral oblique
mammograms were obtained. Bilateral screening digital breast
tomosynthesis was performed. The images were evaluated with
computer-aided detection.

[L MLO synth-2D (1 of 2)]
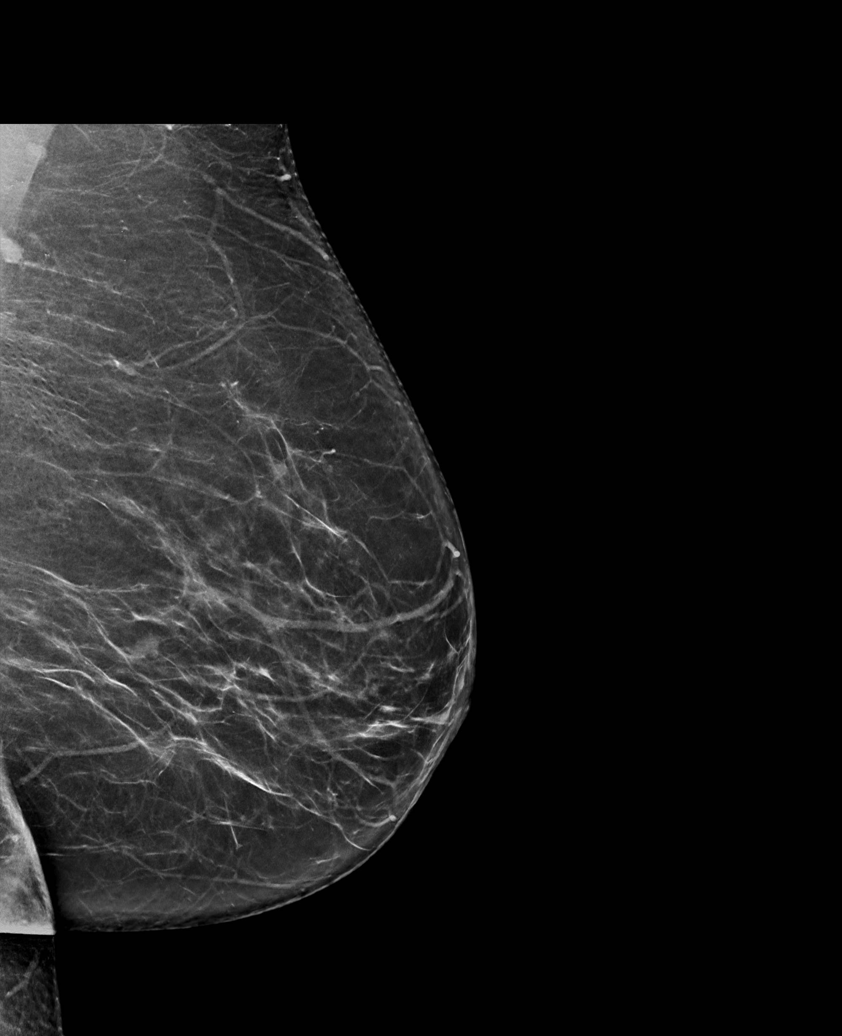

[R CC synth-2D]
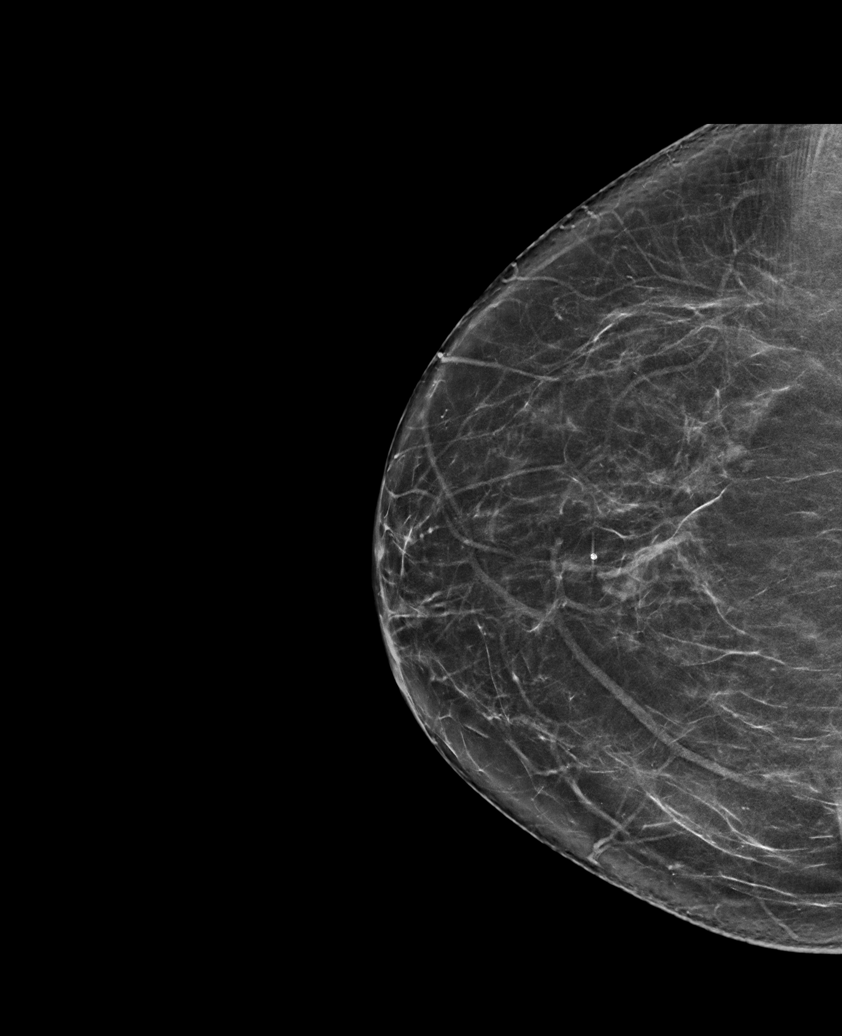

[L CC synth-2D]
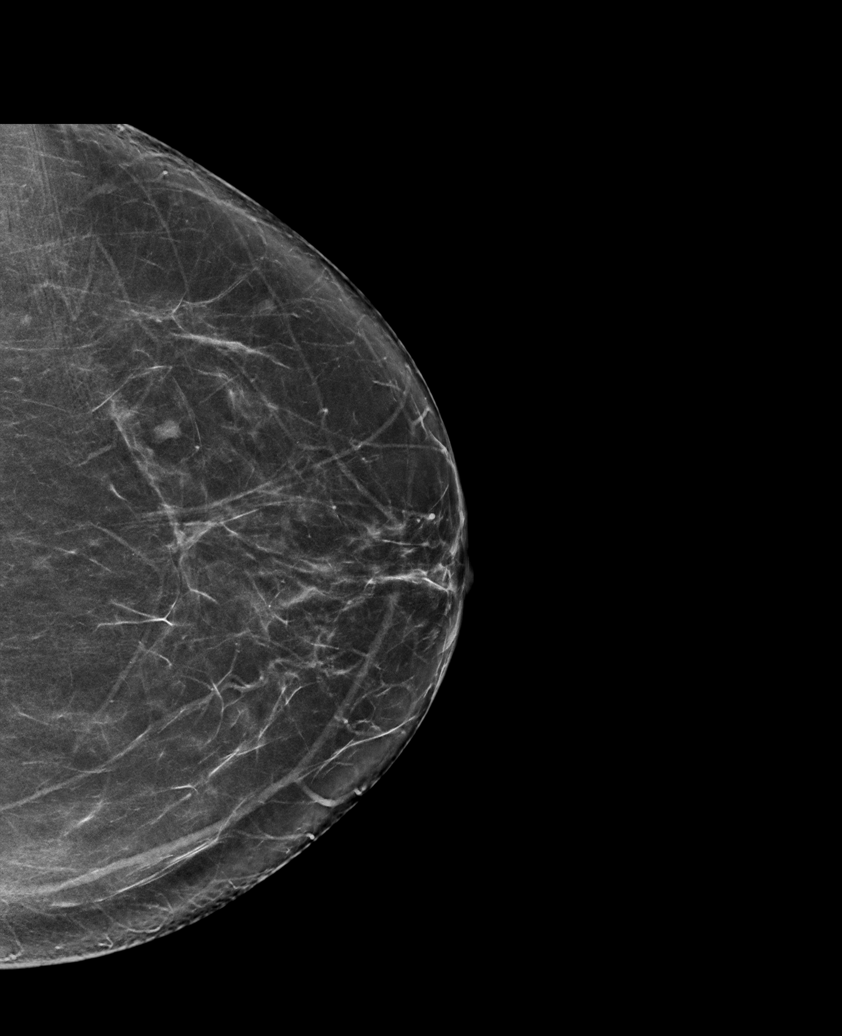

[L MLO synth-2D (2 of 2)]
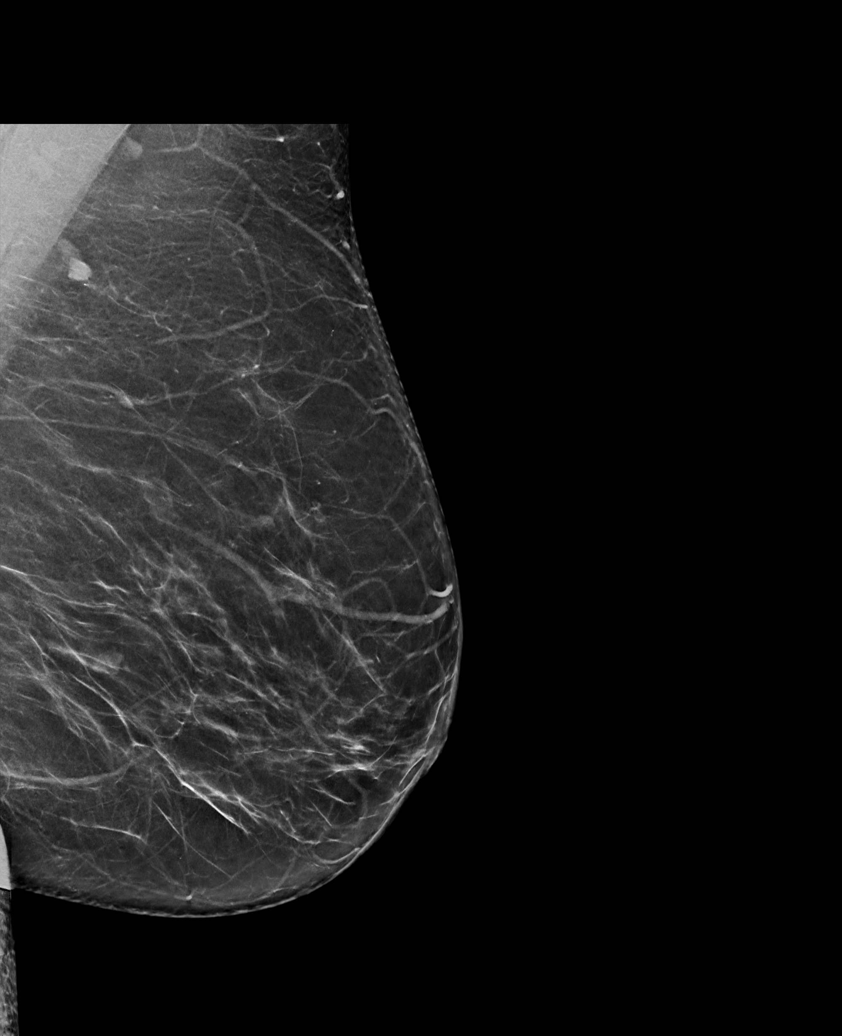

[R MLO synth-2D]
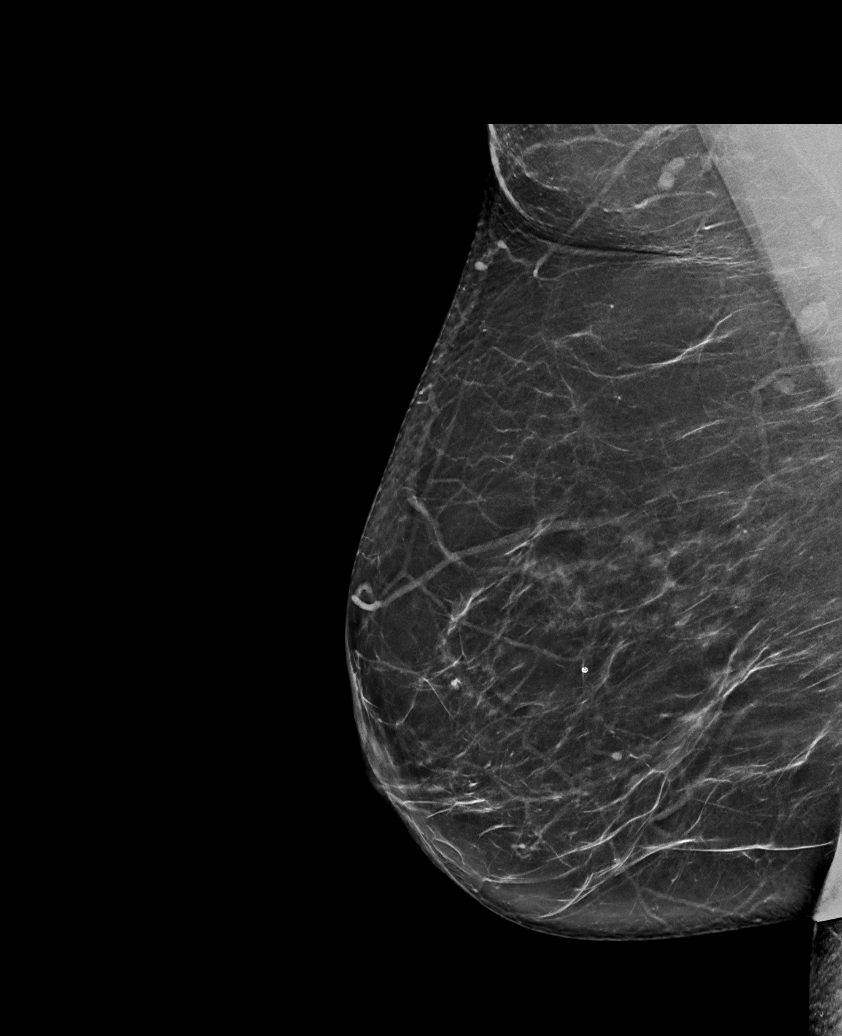

[L MLO tomo · tomo slice 41/80.0]
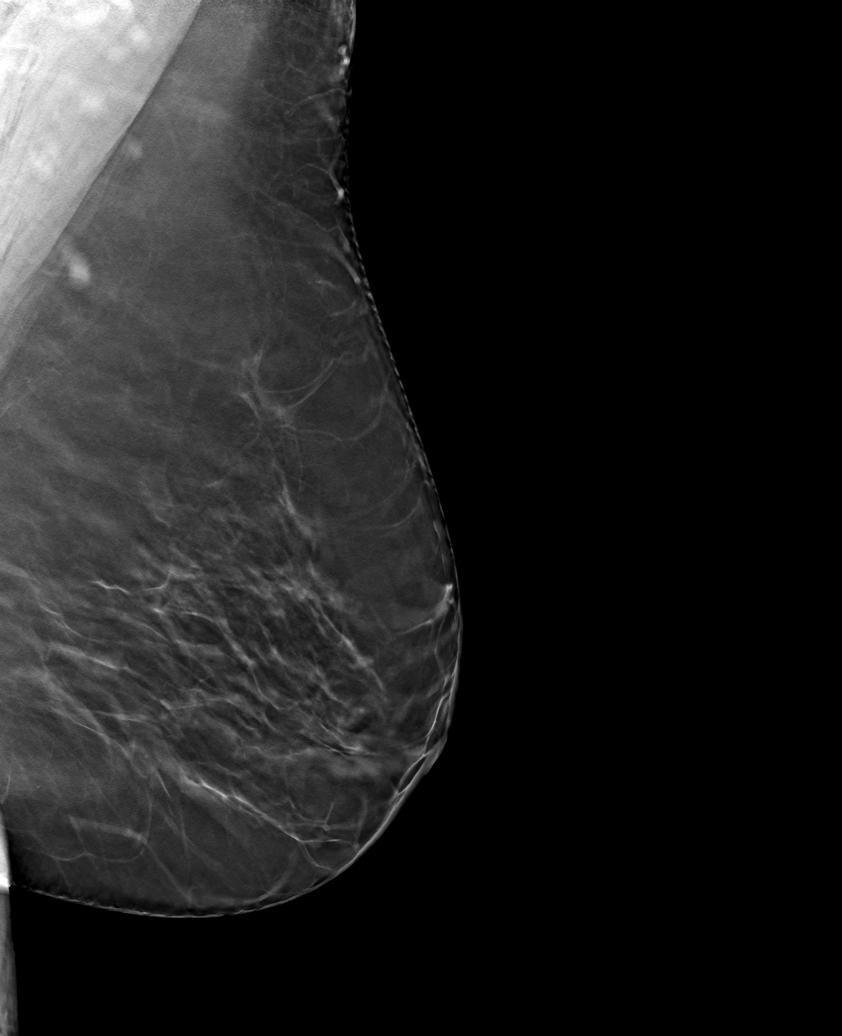

[6 of 30 positions shown; findings below may reference images not displayed]

ACR Breast Density Category b: There are scattered areas of
fibroglandular density.
FINDINGS: There are no findings suspicious for malignancy.
IMPRESSION: No mammographic evidence of malignancy. A result letter of this
screening mammogram will be mailed directly to the patient.

RECOMMENDATION:
Screening mammogram in one year. (Code:51-O-LD2)

BI-RADS CATEGORY  1: Negative.

## 2022-09-12 DIAGNOSIS — R7303 Prediabetes: Secondary | ICD-10-CM | POA: Diagnosis not present

## 2022-09-12 DIAGNOSIS — R7401 Elevation of levels of liver transaminase levels: Secondary | ICD-10-CM | POA: Diagnosis not present

## 2022-09-12 DIAGNOSIS — Z789 Other specified health status: Secondary | ICD-10-CM | POA: Diagnosis not present

## 2022-09-12 DIAGNOSIS — E785 Hyperlipidemia, unspecified: Secondary | ICD-10-CM | POA: Diagnosis not present

## 2022-10-31 DIAGNOSIS — E785 Hyperlipidemia, unspecified: Secondary | ICD-10-CM | POA: Diagnosis not present

## 2022-10-31 DIAGNOSIS — Z0130 Encounter for examination of blood pressure without abnormal findings: Secondary | ICD-10-CM | POA: Diagnosis not present

## 2022-10-31 DIAGNOSIS — Z6835 Body mass index (BMI) 35.0-35.9, adult: Secondary | ICD-10-CM | POA: Diagnosis not present

## 2022-10-31 DIAGNOSIS — R7401 Elevation of levels of liver transaminase levels: Secondary | ICD-10-CM | POA: Diagnosis not present

## 2022-10-31 DIAGNOSIS — R7303 Prediabetes: Secondary | ICD-10-CM | POA: Diagnosis not present

## 2023-01-09 DIAGNOSIS — R030 Elevated blood-pressure reading, without diagnosis of hypertension: Secondary | ICD-10-CM | POA: Diagnosis not present

## 2023-01-09 DIAGNOSIS — R7401 Elevation of levels of liver transaminase levels: Secondary | ICD-10-CM | POA: Diagnosis not present

## 2023-01-09 DIAGNOSIS — Z6834 Body mass index (BMI) 34.0-34.9, adult: Secondary | ICD-10-CM | POA: Diagnosis not present

## 2023-01-09 DIAGNOSIS — E785 Hyperlipidemia, unspecified: Secondary | ICD-10-CM | POA: Diagnosis not present

## 2023-01-09 DIAGNOSIS — R7303 Prediabetes: Secondary | ICD-10-CM | POA: Diagnosis not present

## 2023-04-23 DIAGNOSIS — E785 Hyperlipidemia, unspecified: Secondary | ICD-10-CM | POA: Diagnosis not present

## 2023-04-23 DIAGNOSIS — R030 Elevated blood-pressure reading, without diagnosis of hypertension: Secondary | ICD-10-CM | POA: Diagnosis not present

## 2023-04-23 DIAGNOSIS — E6609 Other obesity due to excess calories: Secondary | ICD-10-CM | POA: Diagnosis not present

## 2023-04-23 DIAGNOSIS — R7303 Prediabetes: Secondary | ICD-10-CM | POA: Diagnosis not present

## 2023-04-23 DIAGNOSIS — Z6835 Body mass index (BMI) 35.0-35.9, adult: Secondary | ICD-10-CM | POA: Diagnosis not present

## 2023-04-25 ENCOUNTER — Other Ambulatory Visit: Payer: Self-pay | Admitting: Adult Medicine

## 2023-04-25 DIAGNOSIS — Z1231 Encounter for screening mammogram for malignant neoplasm of breast: Secondary | ICD-10-CM

## 2023-05-05 DIAGNOSIS — Z1211 Encounter for screening for malignant neoplasm of colon: Secondary | ICD-10-CM | POA: Diagnosis not present

## 2023-05-08 DIAGNOSIS — Z6835 Body mass index (BMI) 35.0-35.9, adult: Secondary | ICD-10-CM | POA: Diagnosis not present

## 2023-05-08 DIAGNOSIS — R7303 Prediabetes: Secondary | ICD-10-CM | POA: Diagnosis not present

## 2023-05-08 DIAGNOSIS — R030 Elevated blood-pressure reading, without diagnosis of hypertension: Secondary | ICD-10-CM | POA: Diagnosis not present

## 2023-05-08 DIAGNOSIS — E6609 Other obesity due to excess calories: Secondary | ICD-10-CM | POA: Diagnosis not present

## 2023-05-08 DIAGNOSIS — E785 Hyperlipidemia, unspecified: Secondary | ICD-10-CM | POA: Diagnosis not present

## 2023-05-11 ENCOUNTER — Ambulatory Visit
Admission: RE | Admit: 2023-05-11 | Discharge: 2023-05-11 | Disposition: A | Discharge status: Home / Self Care | Payer: BC Managed Care – PPO | Source: Ambulatory Visit | Attending: Adult Medicine | Admitting: Adult Medicine

## 2023-05-11 DIAGNOSIS — R0609 Other forms of dyspnea: Secondary | ICD-10-CM | POA: Diagnosis not present

## 2023-05-11 DIAGNOSIS — Z6835 Body mass index (BMI) 35.0-35.9, adult: Secondary | ICD-10-CM | POA: Diagnosis not present

## 2023-05-11 DIAGNOSIS — R0602 Shortness of breath: Secondary | ICD-10-CM | POA: Diagnosis not present

## 2023-05-11 DIAGNOSIS — E785 Hyperlipidemia, unspecified: Secondary | ICD-10-CM | POA: Diagnosis not present

## 2023-05-11 DIAGNOSIS — Z1231 Encounter for screening mammogram for malignant neoplasm of breast: Secondary | ICD-10-CM

## 2023-05-13 ENCOUNTER — Other Ambulatory Visit: Payer: Self-pay | Admitting: Adult Medicine

## 2023-05-13 DIAGNOSIS — R928 Other abnormal and inconclusive findings on diagnostic imaging of breast: Secondary | ICD-10-CM

## 2023-05-14 ENCOUNTER — Ambulatory Visit: Payer: BC Managed Care – PPO

## 2023-05-14 ENCOUNTER — Ambulatory Visit
Admission: RE | Admit: 2023-05-14 | Discharge: 2023-05-14 | Disposition: A | Discharge status: Home / Self Care | Payer: BC Managed Care – PPO | Source: Ambulatory Visit | Attending: Adult Medicine | Admitting: Adult Medicine

## 2023-05-14 DIAGNOSIS — R928 Other abnormal and inconclusive findings on diagnostic imaging of breast: Secondary | ICD-10-CM | POA: Diagnosis not present

## 2023-05-29 DIAGNOSIS — K635 Polyp of colon: Secondary | ICD-10-CM | POA: Diagnosis not present

## 2023-05-29 DIAGNOSIS — Z1211 Encounter for screening for malignant neoplasm of colon: Secondary | ICD-10-CM | POA: Diagnosis not present

## 2023-06-05 DIAGNOSIS — R7303 Prediabetes: Secondary | ICD-10-CM | POA: Diagnosis not present

## 2023-06-05 DIAGNOSIS — R030 Elevated blood-pressure reading, without diagnosis of hypertension: Secondary | ICD-10-CM | POA: Diagnosis not present

## 2023-06-05 DIAGNOSIS — Z6835 Body mass index (BMI) 35.0-35.9, adult: Secondary | ICD-10-CM | POA: Diagnosis not present

## 2023-06-05 DIAGNOSIS — E785 Hyperlipidemia, unspecified: Secondary | ICD-10-CM | POA: Diagnosis not present

## 2023-06-05 DIAGNOSIS — E6609 Other obesity due to excess calories: Secondary | ICD-10-CM | POA: Diagnosis not present

## 2023-06-10 DIAGNOSIS — K635 Polyp of colon: Secondary | ICD-10-CM | POA: Diagnosis not present

## 2023-06-30 DIAGNOSIS — L821 Other seborrheic keratosis: Secondary | ICD-10-CM | POA: Diagnosis not present

## 2023-06-30 DIAGNOSIS — L570 Actinic keratosis: Secondary | ICD-10-CM | POA: Diagnosis not present

## 2023-06-30 DIAGNOSIS — D1801 Hemangioma of skin and subcutaneous tissue: Secondary | ICD-10-CM | POA: Diagnosis not present

## 2023-07-10 DIAGNOSIS — Z0130 Encounter for examination of blood pressure without abnormal findings: Secondary | ICD-10-CM | POA: Diagnosis not present

## 2023-07-10 DIAGNOSIS — E559 Vitamin D deficiency, unspecified: Secondary | ICD-10-CM | POA: Diagnosis not present

## 2023-07-10 DIAGNOSIS — E6609 Other obesity due to excess calories: Secondary | ICD-10-CM | POA: Diagnosis not present

## 2023-07-10 DIAGNOSIS — Z6837 Body mass index (BMI) 37.0-37.9, adult: Secondary | ICD-10-CM | POA: Diagnosis not present

## 2023-07-10 DIAGNOSIS — R7303 Prediabetes: Secondary | ICD-10-CM | POA: Diagnosis not present

## 2023-07-10 DIAGNOSIS — E785 Hyperlipidemia, unspecified: Secondary | ICD-10-CM | POA: Diagnosis not present

## 2023-07-10 DIAGNOSIS — R030 Elevated blood-pressure reading, without diagnosis of hypertension: Secondary | ICD-10-CM | POA: Diagnosis not present

## 2023-08-07 DIAGNOSIS — Z6836 Body mass index (BMI) 36.0-36.9, adult: Secondary | ICD-10-CM | POA: Diagnosis not present

## 2023-08-07 DIAGNOSIS — R030 Elevated blood-pressure reading, without diagnosis of hypertension: Secondary | ICD-10-CM | POA: Diagnosis not present

## 2023-08-07 DIAGNOSIS — E785 Hyperlipidemia, unspecified: Secondary | ICD-10-CM | POA: Diagnosis not present

## 2023-08-07 DIAGNOSIS — E6609 Other obesity due to excess calories: Secondary | ICD-10-CM | POA: Diagnosis not present

## 2023-08-07 DIAGNOSIS — Z0130 Encounter for examination of blood pressure without abnormal findings: Secondary | ICD-10-CM | POA: Diagnosis not present

## 2023-08-07 DIAGNOSIS — R7303 Prediabetes: Secondary | ICD-10-CM | POA: Diagnosis not present

## 2023-08-07 DIAGNOSIS — Z79899 Other long term (current) drug therapy: Secondary | ICD-10-CM | POA: Diagnosis not present

## 2023-09-25 ENCOUNTER — Ambulatory Visit: Payer: BC Managed Care – PPO | Attending: Cardiovascular Disease | Admitting: Cardiovascular Disease

## 2023-09-25 ENCOUNTER — Encounter: Payer: Self-pay | Admitting: Cardiovascular Disease

## 2023-09-25 VITALS — BP 118/76 | HR 73 | Ht 64.0 in | Wt 221.0 lb

## 2023-09-25 DIAGNOSIS — E785 Hyperlipidemia, unspecified: Secondary | ICD-10-CM

## 2023-09-25 DIAGNOSIS — E663 Overweight: Secondary | ICD-10-CM | POA: Diagnosis not present

## 2023-09-25 DIAGNOSIS — R635 Abnormal weight gain: Secondary | ICD-10-CM

## 2023-09-25 NOTE — Assessment & Plan Note (Signed)
History of hyperlipidemia with lipid profile performed by her PCP 07/10/2023 revealing total cholesterol 299, LDL 218 and HDL of 56.  She has been intolerant to statins in the past.  She was given a prescription by her PCP for pravastatin which I have encouraged her to start.  We will get a coronary calcium score to her stratify her.  If she is statin intolerant she would be a good candidate to start a PCSK9.

## 2023-09-25 NOTE — Patient Instructions (Signed)
Medication Instructions:   START PRAVASTATIN  *If you need a refill on your cardiac medications before your next appointment, please call your pharmacy*   Testing/Procedures:  CORONARY CALCIUM SCORING CT SCAN AT THE [facility]   Follow-Up: At [facility], you and your health needs are our priority.  As part of our continuing mission to provide you with exceptional heart care, we have created designated Provider Care Teams.  These Care Teams include your primary Cardiologist (physician) and Advanced Practice Providers (APPs -  Physician Assistants and Nurse Practitioners) who all work together to provide you with the care you need, when you need it.   Your next appointment:   6 month(s)  Provider:   Nanetta Batty MD

## 2023-09-25 NOTE — Assessment & Plan Note (Signed)
History of morbid obesity with BMI of 38.  I am referring her to [facility] diet wellness for physician assisted weight loss.

## 2023-09-25 NOTE — Progress Notes (Signed)
09/25/2023 [place]   [birth year]  [id number]  Primary Physician Patient, No Pcp Per Primary Cardiologist: Runell Gess MD Roseanne Reno  HPI:  [place] is a 63 y.o. severely overweight married Caucasian female mother of 2 children, grandmother of 3 grandchildren who is an Print production planner at a [employer].  She was referred by her PCP for cardiovascular valuation because of hyperlipidemia.  She has no other cardiac risk factors.  She is never had a heart attack or stroke.  She really does not exercise but denies chest pain or shortness of breath.  She has lost 60 pounds in the past.  Her most recent lipid profile performed 07/10/2023 revealed a total cholesterol 299, LDL 218 and HDL 56.   No outpatient medications have been marked as taking for the 09/25/23 encounter (Office Visit) with Runell Gess, MD.     No Known Allergies  Social History   Socioeconomic History   Marital status: Married    Spouse name: Not on file   Number of children: 2   Years of education: 14   Highest education level: Not on file  Occupational History   Occupation: Sales executive    Employer: [employer]  Tobacco Use   Smoking status: Never   Smokeless tobacco: Never  Substance and Sexual Activity   Alcohol use: No   Drug use: No   Sexual activity: Not on file  Other Topics Concern   Not on file  Social History Narrative   [place] husband. She works as a Sales executive. She enjoys reading a working outside in the yard.   Social Drivers of Corporate investment banker Strain: Not on file  Food Insecurity: Not on file  Transportation Needs: Not on file  Physical Activity: Not on file  Stress: Not on file  Social Connections: Not on file  Intimate Partner Violence: Not on file     Review of Systems: General: negative for chills, fever, night sweats or weight changes.   Cardiovascular: negative for chest pain, dyspnea on exertion, edema, orthopnea, palpitations, paroxysmal nocturnal dyspnea or shortness of breath Dermatological: negative for rash Respiratory: negative for cough or wheezing Urologic: negative for hematuria Abdominal: negative for nausea, vomiting, diarrhea, bright red blood per rectum, melena, or hematemesis Neurologic: negative for visual changes, syncope, or dizziness All other systems reviewed and are otherwise negative except as noted above.    Blood pressure 118/76, pulse 73, height 5\' 4"  (1.626 m), weight 221 lb (100.2 kg), SpO2 97%.  General appearance: alert and no distress Neck: no adenopathy, no carotid bruit, no JVD, supple, symmetrical, trachea midline, and thyroid not enlarged, symmetric, no tenderness/mass/nodules Lungs: clear to auscultation bilaterally Heart: regular rate and rhythm, S1, S2 normal, no murmur, click, rub or gallop Extremities: extremities normal, atraumatic, no cyanosis or edema Pulses: 2+ and symmetric Skin: Skin color, texture, turgor normal. No rashes or lesions Neurologic: Grossly normal  EKG EKG Interpretation Date/Time:  Friday September 25 2023 09:48:07 EST Ventricular Rate:  71 PR Interval:  162 QRS Duration:  78 QT Interval:  402 QTC Calculation: 436 R Axis:   80  Text Interpretation: Normal sinus rhythm Possible Left atrial enlargement No previous ECGs available Confirmed by Nanetta Batty [phone]) on 09/25/2023 9:56:58 AM    ASSESSMENT AND PLAN:   HLD (hyperlipidemia) History of hyperlipidemia with lipid profile performed by her PCP  07/10/2023 revealing total cholesterol 299, LDL 218 and HDL of 56.  She has been intolerant to statins in the past.  She was given a prescription by her PCP for pravastatin which I have encouraged her to start.  We will get a coronary calcium score to her stratify her.  If she is statin intolerant she would be a good candidate to start a  PCSK9.  Overweight History of morbid obesity with BMI of 38.  I am referring her to [facility] diet wellness for physician assisted weight loss.     Runell Gess MD FACP,FACC,FAHA, [facility] 09/25/2023 10:14 AM

## 2023-09-30 ENCOUNTER — Encounter: Payer: Self-pay | Admitting: Cardiovascular Disease

## 2023-09-30 ENCOUNTER — Other Ambulatory Visit (HOSPITAL_COMMUNITY): Payer: Self-pay

## 2023-09-30 MED ORDER — PRAVASTATIN SODIUM 20 MG PO TABS: 20.0000 mg | ORAL_TABLET | Freq: Every day | ORAL | 3 refills | Status: AC

## 2023-09-30 MED ORDER — PRAVASTATIN SODIUM 20 MG PO TABS: 20.0000 mg | ORAL_TABLET | Freq: Every day | ORAL | 3 refills | Status: DC

## 2023-10-16 ENCOUNTER — Ambulatory Visit (HOSPITAL_BASED_OUTPATIENT_CLINIC_OR_DEPARTMENT_OTHER)
Admission: RE | Admit: 2023-10-16 | Discharge: 2023-10-16 | Disposition: A | Discharge status: Home / Self Care | Payer: Self-pay | Source: Ambulatory Visit | Attending: Cardiovascular Disease | Admitting: Cardiovascular Disease

## 2023-10-16 DIAGNOSIS — E785 Hyperlipidemia, unspecified: Secondary | ICD-10-CM | POA: Insufficient documentation

## 2023-10-19 DIAGNOSIS — E785 Hyperlipidemia, unspecified: Secondary | ICD-10-CM

## 2023-10-28 ENCOUNTER — Encounter (INDEPENDENT_AMBULATORY_CARE_PROVIDER_SITE_OTHER): Payer: Self-pay

## 2023-12-18 DIAGNOSIS — E785 Hyperlipidemia, unspecified: Secondary | ICD-10-CM | POA: Diagnosis not present

## 2023-12-19 LAB — LIPID PANEL
Chol/HDL Ratio: 4.3 ratio (ref 0.0–4.4)
Cholesterol, Total: 251 mg/dL — ABNORMAL HIGH (ref 100–199)
HDL: 59 mg/dL (ref >39)
LDL Chol Calc (NIH): 170 mg/dL — ABNORMAL HIGH (ref 0–99)
Triglycerides: 122 mg/dL (ref 0–149)
VLDL Cholesterol Cal: 22 mg/dL (ref 5–40)

## 2023-12-19 LAB — HEPATIC FUNCTION PANEL
ALT: 22 IU/L (ref 0–32)
AST: 22 IU/L (ref 0–40)
Albumin: 4.2 g/dL (ref 3.9–4.9)
Alkaline Phosphatase: 116 IU/L (ref 44–121)
Bilirubin Total: 0.6 mg/dL (ref 0.0–1.2)
Bilirubin, Direct: 0.18 mg/dL (ref 0.00–0.40)
Total Protein: 6.8 g/dL (ref 6.0–8.5)

## 2023-12-25 ENCOUNTER — Telehealth: Payer: Self-pay

## 2023-12-25 ENCOUNTER — Telehealth: Payer: Self-pay | Admitting: Pharmacist

## 2023-12-25 ENCOUNTER — Encounter: Payer: Self-pay | Admitting: Pharmacist

## 2023-12-25 ENCOUNTER — Other Ambulatory Visit (HOSPITAL_COMMUNITY): Payer: Self-pay

## 2023-12-25 ENCOUNTER — Ambulatory Visit: Payer: BC Managed Care – PPO | Attending: Cardiology | Admitting: Pharmacist

## 2023-12-25 DIAGNOSIS — R931 Abnormal findings on diagnostic imaging of heart and coronary circulation: Secondary | ICD-10-CM

## 2023-12-25 DIAGNOSIS — E785 Hyperlipidemia, unspecified: Secondary | ICD-10-CM | POA: Diagnosis not present

## 2023-12-25 DIAGNOSIS — I700 Atherosclerosis of aorta: Secondary | ICD-10-CM | POA: Insufficient documentation

## 2023-12-25 DIAGNOSIS — R635 Abnormal weight gain: Secondary | ICD-10-CM

## 2023-12-25 NOTE — Telephone Encounter (Signed)
 Pharmacy Patient Advocate Encounter  Received notification from [facility] that Prior Authorization for REPATHA has been APPROVED from 12/25/23 to 12/24/24. Ran test claim, Copay is $35. This test claim was processed through [facility]- copay amounts may vary at other pharmacies due to pharmacy/plan contracts, or as the patient moves through the different stages of their insurance plan.

## 2023-12-25 NOTE — Progress Notes (Signed)
 Patient ID: [place]                 DOB: 20-Jan-1961                    MRN: [id number]     HPI: [place] is a 63 y.o. female patient referred to lipid clinic by Dr Katheryne Pane to discuss lipid monitoring and weight loss. PMH is significant for HLD (possibly familial), elevated coronary calcium score and obesity. Patient is intolerant to high intensity statins.  Patient presents today to discuss lipid management. LDL has been chronically high. TC 299, LDL 218 on 07/10/23. Reports strong family history of hyperlipidemia.  Reports she has tried atorvastatin and rosuvastatin in the past but both caused muscle and joint pain. Is able to tolerate pravastatin  20mg  once daily but still experiences occasional myalgias.  PCP started her on Rybelsus about a year ago which helped her lower her A1c and led to weight loss. However she no longer had insurance approval and has since gained weight when it was discontinued. Reports her biggest concern is snacking between meals.  Current Medications:  Pravastatin  20mg  daily  Intolerances:  Lovastatin Atorvastatin Rosuvastatin  Risk Factors:  HLD Elevated coronary calcium score Family history  LDL goal: <70  Labs: TC 251, Trigs 122, HDL 59, LDL 170 (12/18/23 on pravastatin  20mg )  Imaging: IMPRESSION:  1. Coronary calcium score of 255. This was 96th percentile for age, gender, and race matched controls.   2. Aortic atherosclerosis.  Past Medical History:  Diagnosis Date   History of chickenpox    History of migraine    Dont have them currently    Hyperlipidemia     Current Outpatient Medications on File Prior to Visit  Medication Sig Dispense Refill   phentermine  15 MG capsule Take 15 mg by mouth daily. (Patient not taking: Reported on 09/25/2023)     pravastatin  (PRAVACHOL ) 20 MG tablet Take 1 tablet (20 mg total) by mouth daily. 90 tablet 3   No current facility-administered medications on file prior to visit.    No  Known Allergies  Assessment/Plan:  1. Hyperlipidemia - Patient last LDL 170 which is improved from 218 but remains well above goal of <70. Unlikely pravastatin  increase will reduce LDL to goal and may exacerbate adverse effects. Recommend starting PCSK9i due to CAD.  Using demo pen, educated patient on mechanism of action, storage, site selection, administration, and possible adverse effects. Will complete PA and contact patient with results. Recheck lipid panel in 2-3 months.  2. Weight Loss - Patient demonstrated positive results with oral semaglutide. Would likely benefit from injectable. Using demo pen, educated patient on mechanism of action, storage, site selection, administration, and possible adverse effects. Explained dose titration. Discussed healthy eating and recommended increasing vegetables, lean proteins, healthy fats, and whole grains. Will complete PA and contact patient with result.  [place], PharmD, BCACP, CDCES, CPP [address], [address] [place], [place], [address] Phone: [phone], Fax: [phone]

## 2023-12-25 NOTE — Telephone Encounter (Signed)
 Pharmacy Patient Advocate Encounter   Received notification from Physician's Office that prior authorization for REPATHA is required/requested.   Insurance verification completed.   The patient is insured through [facility] .   Per test claim: PA required; PA submitted to above mentioned insurance via CoverMyMeds Key/confirmation #/EOC [id number] Status is pending

## 2023-12-25 NOTE — Telephone Encounter (Signed)
 PA request for Repatha has been Submitted. Team messaged you on [facility]. Plan/benefit exclusion. New Encounter has been or will be created for follow up. For additional info see Pharmacy Prior Auth telephone encounter from 12/25/23.

## 2023-12-25 NOTE — Patient Instructions (Addendum)
 It was nice meeting you today  We would like your LDL (bad cholesterol) to be less than 70  Please continue your pravastatin  20mg  daily  The medication we spoke about today is called Repatha which is an injection you would take once every 2 weeks  I will complete the prior authorization for you and contact you when it is approved. We would like to recheck your fasting lipid panel in about 3 months  I will also complete the prior authorization for [facility] and let you know the result  Please let me know if you have any questions  Joelene Murrain, PharmD, BCACP, CDCES, CPP [address], [address] [place], [place], [address] Phone: [phone], Fax: [phone]

## 2023-12-25 NOTE — Telephone Encounter (Signed)
Please complete PA for Repatha and [facility]

## 2023-12-25 NOTE — Telephone Encounter (Signed)
 Pharmacy Patient Advocate Encounter   Received notification from Physician's Office that prior authorization for [facility] is required/requested.   Insurance verification completed.   The patient is insured through [facility] .   Per test claim: PA required; PA submitted to above mentioned insurance via CoverMyMeds Key/confirmation #/EOC [id number] Status is pending

## 2023-12-25 NOTE — Telephone Encounter (Signed)
 Pharmacy Patient Advocate Encounter  Received notification from [facility] that Prior Authorization for [facility] has been DENIED.  Full denial letter will be uploaded to the media tab. See denial reason below.  PLAN BENEFIT EXCLUSION

## 2023-12-31 ENCOUNTER — Other Ambulatory Visit (HOSPITAL_COMMUNITY): Payer: Self-pay

## 2023-12-31 MED ORDER — REPATHA SURECLICK 140 MG/ML ~~LOC~~ SOAJ
1.0000 mL | SUBCUTANEOUS | 3 refills | Status: DC
  Filled 2023-12-31: qty 6, 84d supply, fill #0

## 2023-12-31 NOTE — Addendum Note (Signed)
 Addended by: Sunny English on: 12/31/2023 08:03 AM   Modules accepted: Orders

## 2024-01-12 NOTE — Addendum Note (Signed)
 Addended by: Sunny English on: 01/12/2024 07:47 AM   Modules accepted: Orders

## 2024-01-14 ENCOUNTER — Encounter: Payer: Self-pay | Admitting: Cardiovascular Disease

## 2024-01-20 ENCOUNTER — Other Ambulatory Visit (HOSPITAL_COMMUNITY): Payer: Self-pay

## 2024-01-20 MED ORDER — WEGOVY 0.5 MG/0.5ML ~~LOC~~ SOAJ
0.5000 mg | SUBCUTANEOUS | 0 refills | Status: DC
  Filled 2024-01-20: qty 2, 28d supply, fill #0

## 2024-01-20 NOTE — Addendum Note (Signed)
 Addended by: Sunny English on: 01/20/2024 01:21 PM   Modules accepted: Orders

## 2024-01-20 NOTE — Telephone Encounter (Signed)
 Contacted patient. Is willing to pay cash price for Wegovy .

## 2024-01-21 ENCOUNTER — Telehealth: Payer: Self-pay | Admitting: Pharmacist

## 2024-01-21 ENCOUNTER — Other Ambulatory Visit (HOSPITAL_COMMUNITY): Payer: Self-pay

## 2024-01-21 ENCOUNTER — Ambulatory Visit: Admitting: Pharmacist

## 2024-01-21 MED ORDER — OZEMPIC (0.25 OR 0.5 MG/DOSE) 2 MG/3ML ~~LOC~~ SOPN: PEN_INJECTOR | SUBCUTANEOUS | 0 refills | Status: DC

## 2024-01-21 NOTE — Telephone Encounter (Signed)
 Patient interested in starting Wegovy  but PA was denied. Is willing to pay cash price. Offered sample to get her started, however no Wegovy  available, only Ozempic . Taught patient how to prime Ozempic , dial dose, and administration. Patient will inject 0.25mg  once weekly for 4 weeks and then increase to 0.5mg  weekly for 2 weeks. Then can start Wegovy  from pharmacy.

## 2024-02-29 ENCOUNTER — Encounter: Payer: Self-pay | Admitting: Pharmacist

## 2024-02-29 DIAGNOSIS — E663 Overweight: Secondary | ICD-10-CM

## 2024-02-29 DIAGNOSIS — R635 Abnormal weight gain: Secondary | ICD-10-CM

## 2024-02-29 DIAGNOSIS — I700 Atherosclerosis of aorta: Secondary | ICD-10-CM

## 2024-03-15 NOTE — Addendum Note (Signed)
 Addended by: DARRELL BRUCKNER on: 03/15/2024 07:33 AM   Modules accepted: Orders

## 2024-03-25 ENCOUNTER — Other Ambulatory Visit (HOSPITAL_COMMUNITY): Payer: Self-pay

## 2024-03-25 MED ORDER — WEGOVY 1 MG/0.5ML ~~LOC~~ SOAJ
1.0000 mg | SUBCUTANEOUS | 0 refills | Status: DC
  Filled 2024-03-25: qty 2, 28d supply, fill #0

## 2024-03-25 NOTE — Addendum Note (Signed)
 Addended by: DARRELL BRUCKNER on: 03/25/2024 03:31 PM   Modules accepted: Orders

## 2024-03-28 ENCOUNTER — Other Ambulatory Visit (HOSPITAL_COMMUNITY): Payer: Self-pay

## 2024-04-27 ENCOUNTER — Telehealth: Payer: Self-pay | Admitting: Pharmacist

## 2024-04-27 ENCOUNTER — Other Ambulatory Visit (HOSPITAL_COMMUNITY): Payer: Self-pay

## 2024-04-27 DIAGNOSIS — E663 Overweight: Secondary | ICD-10-CM

## 2024-04-27 DIAGNOSIS — R635 Abnormal weight gain: Secondary | ICD-10-CM

## 2024-04-27 MED ORDER — WEGOVY 1.7 MG/0.75ML ~~LOC~~ SOAJ
1.7000 mg | SUBCUTANEOUS | 0 refills | Status: DC
  Filled 2024-04-27: qty 3, 28d supply, fill #0

## 2024-04-27 NOTE — Telephone Encounter (Signed)
 Spoke with patient regarding Wegovy  vs Zepbound. Patient has only lost 4# despite increasing to 1.7mg . Is interested in trying Zepbound through manufacturer. Is willing to increase to Wegovy  2.4mg  while Zepbound Rx processes.

## 2024-05-06 MED ORDER — ZEPBOUND 5 MG/0.5ML ~~LOC~~ SOLN: 5.0000 mg | SUBCUTANEOUS | 0 refills | Status: DC

## 2024-05-06 NOTE — Addendum Note (Signed)
 Addended by: DARRELL BRUCKNER on: 05/06/2024 12:50 PM   Modules accepted: Orders

## 2024-06-13 ENCOUNTER — Telehealth: Payer: Self-pay | Admitting: Pharmacist

## 2024-06-13 DIAGNOSIS — R635 Abnormal weight gain: Secondary | ICD-10-CM

## 2024-06-13 DIAGNOSIS — E663 Overweight: Secondary | ICD-10-CM

## 2024-06-13 MED ORDER — ZEPBOUND 7.5 MG/0.5ML ~~LOC~~ SOLN: 7.5000 mg | SUBCUTANEOUS | 0 refills | Status: DC

## 2024-06-13 NOTE — Telephone Encounter (Signed)
-----   Message from Lonni Bolk sent at 06/08/2024  4:54 PM EDT ----- Send zepbound

## 2024-07-08 MED ORDER — ZEPBOUND 10 MG/0.5ML ~~LOC~~ SOLN: 10.0000 mg | SUBCUTANEOUS | 0 refills | Status: DC

## 2024-07-08 NOTE — Addendum Note (Signed)
 Addended by: DARRELL BRUCKNER on: 07/08/2024 03:02 PM   Modules accepted: Orders

## 2024-08-01 ENCOUNTER — Other Ambulatory Visit: Payer: Self-pay | Admitting: Cardiovascular Disease

## 2024-08-01 DIAGNOSIS — E663 Overweight: Secondary | ICD-10-CM

## 2024-08-01 DIAGNOSIS — R635 Abnormal weight gain: Secondary | ICD-10-CM

## 2024-08-01 DIAGNOSIS — I700 Atherosclerosis of aorta: Secondary | ICD-10-CM

## 2024-08-05 MED ORDER — ZEPBOUND 10 MG/0.5ML ~~LOC~~ SOLN: 10.0000 mg | SUBCUTANEOUS | 0 refills | Status: DC

## 2024-08-05 NOTE — Addendum Note (Signed)
 Addended by: DARRELL BRUCKNER on: 08/05/2024 11:23 AM   Modules accepted: Orders

## 2024-08-29 MED ORDER — ZEPBOUND 10 MG/0.5ML ~~LOC~~ SOLN: 10.0000 mg | SUBCUTANEOUS | 0 refills | Status: DC

## 2024-08-29 NOTE — Addendum Note (Signed)
 Addended by: DARRELL BAREFOOT on: 08/29/2024 07:35 AM   Modules accepted: Orders

## 2024-09-28 MED ORDER — ZEPBOUND 12.5 MG/0.5ML ~~LOC~~ SOLN: 12.5000 mg | SUBCUTANEOUS | 11 refills | Status: AC

## 2024-09-28 NOTE — Addendum Note (Signed)
 Addended by: Trinia Georgi D on: 09/28/2024 03:40 PM   Modules accepted: Orders
